# Patient Record
Sex: Female | Born: 1995 | Race: Black or African American | Hispanic: No | Marital: Single | Smoking: Current every day smoker
Health system: Southern US, Community
[De-identification: ages and names within clinical notes are randomized; demographics above are authoritative.]

## PROBLEM LIST (undated history)

## (undated) DIAGNOSIS — J45909 Unspecified asthma, uncomplicated: Secondary | ICD-10-CM

## (undated) HISTORY — PX: OTHER SURGICAL HISTORY: SHX169

## (undated) HISTORY — PX: TONSILLECTOMY: SUR1361

---

## 2018-07-28 ENCOUNTER — Encounter (HOSPITAL_COMMUNITY): Payer: Self-pay

## 2018-07-28 ENCOUNTER — Emergency Department (HOSPITAL_COMMUNITY)
Admission: EM | Admit: 2018-07-28 | Discharge: 2018-07-29 | Disposition: A | Discharge status: Home / Self Care | Payer: Self-pay | Attending: Emergency Medicine | Admitting: Emergency Medicine

## 2018-07-28 ENCOUNTER — Emergency Department (HOSPITAL_COMMUNITY): Payer: Self-pay

## 2018-07-28 ENCOUNTER — Other Ambulatory Visit: Payer: Self-pay

## 2018-07-28 DIAGNOSIS — J45909 Unspecified asthma, uncomplicated: Secondary | ICD-10-CM | POA: Insufficient documentation

## 2018-07-28 DIAGNOSIS — R52 Pain, unspecified: Secondary | ICD-10-CM

## 2018-07-28 DIAGNOSIS — N801 Endometriosis of ovary: Secondary | ICD-10-CM | POA: Insufficient documentation

## 2018-07-28 DIAGNOSIS — F17210 Nicotine dependence, cigarettes, uncomplicated: Secondary | ICD-10-CM | POA: Insufficient documentation

## 2018-07-28 DIAGNOSIS — N80129 Deep endometriosis of ovary, unspecified ovary: Secondary | ICD-10-CM

## 2018-07-28 DIAGNOSIS — R1032 Left lower quadrant pain: Secondary | ICD-10-CM

## 2018-07-28 HISTORY — DX: Unspecified asthma, uncomplicated: J45.909

## 2018-07-28 LAB — URINALYSIS, ROUTINE W REFLEX MICROSCOPIC
Bacteria, UA: NONE SEEN
Bilirubin Urine: NEGATIVE
Glucose, UA: NEGATIVE mg/dL
Ketones, ur: NEGATIVE mg/dL
Leukocytes,Ua: NEGATIVE
Nitrite: NEGATIVE
Protein, ur: NEGATIVE mg/dL
Specific Gravity, Urine: 1.02 (ref 1.005–1.030)
pH: 7 (ref 5.0–8.0)

## 2018-07-28 LAB — WET PREP, GENITAL
Sperm: NONE SEEN
Trich, Wet Prep: NONE SEEN
Yeast Wet Prep HPF POC: NONE SEEN

## 2018-07-28 LAB — COMPREHENSIVE METABOLIC PANEL
ALT: 14 U/L (ref 0–44)
AST: 26 U/L (ref 15–41)
Albumin: 4.9 g/dL (ref 3.5–5.0)
Alkaline Phosphatase: 60 U/L (ref 38–126)
Anion gap: 7 (ref 5–15)
BUN: 7 mg/dL (ref 6–20)
CO2: 25 mmol/L (ref 22–32)
Calcium: 9.7 mg/dL (ref 8.9–10.3)
Chloride: 106 mmol/L (ref 98–111)
Creatinine, Ser: 0.69 mg/dL (ref 0.44–1.00)
GFR calc Af Amer: >60 mL/min (ref >60)
GFR calc non Af Amer: >60 mL/min (ref >60)
Glucose, Bld: 80 mg/dL (ref 70–99)
Potassium: 3.7 mmol/L (ref 3.5–5.1)
Sodium: 138 mmol/L (ref 135–145)
Total Bilirubin: 0.9 mg/dL (ref 0.3–1.2)
Total Protein: 8.4 g/dL — ABNORMAL HIGH (ref 6.5–8.1)

## 2018-07-28 LAB — CBC
HCT: 39.8 % (ref 36.0–46.0)
Hemoglobin: 12.5 g/dL (ref 12.0–15.0)
MCH: 30.3 pg (ref 26.0–34.0)
MCHC: 31.4 g/dL (ref 30.0–36.0)
MCV: 96.6 fL (ref 80.0–100.0)
Platelets: 347 10*3/uL (ref 150–400)
RBC: 4.12 MIL/uL (ref 3.87–5.11)
RDW: 12.6 % (ref 11.5–15.5)
WBC: 7.1 10*3/uL (ref 4.0–10.5)
nRBC: 0 % (ref 0.0–0.2)

## 2018-07-28 LAB — I-STAT BETA HCG BLOOD, ED (MC, WL, AP ONLY): I-stat hCG, quantitative: <5 m[IU]/mL (ref <5)

## 2018-07-28 LAB — LIPASE, BLOOD: Lipase: 28 U/L (ref 11–51)

## 2018-07-28 LAB — PREGNANCY, URINE: Preg Test, Ur: NEGATIVE

## 2018-07-28 MED ORDER — NAPROXEN 500 MG PO TABS: 500.0000 mg | ORAL_TABLET | Freq: Two times a day (BID) | ORAL | 0 refills | Status: AC

## 2018-07-28 MED ORDER — SODIUM CHLORIDE 0.9% FLUSH: 3.0000 mL | Freq: Once | INTRAVENOUS | Status: DC

## 2018-07-28 MED ORDER — AZITHROMYCIN 250 MG PO TABS
1000.0000 mg | ORAL_TABLET | Freq: Once | ORAL | Status: AC
  Administered 2018-07-28: 1000 mg via ORAL
  Filled 2018-07-28: qty 4

## 2018-07-28 MED ORDER — CEFTRIAXONE SODIUM 250 MG IJ SOLR
250.0000 mg | Freq: Once | INTRAMUSCULAR | Status: AC
  Administered 2018-07-28: 22:00:00 250 mg via INTRAMUSCULAR
  Filled 2018-07-28: qty 250

## 2018-07-28 MED ORDER — IOHEXOL 300 MG/ML  SOLN
100.0000 mL | Freq: Once | INTRAMUSCULAR | Status: AC | PRN
  Administered 2018-07-28: 80 mL via INTRAVENOUS

## 2018-07-28 MED ORDER — STERILE WATER FOR INJECTION IJ SOLN
INTRAMUSCULAR | Status: AC
  Filled 2018-07-28: qty 10

## 2018-07-28 NOTE — ED Notes (Signed)
Pelvic supplies at bedside. 

## 2018-07-28 NOTE — Discharge Instructions (Addendum)
You were seen in the ED for vaginal bleeding and pelvic pain.    Ultrasound showed RIGHT endometrioma.  You need a second pelvic ultrasound in 6-12 weeks to re-evaluate this area.  Call OBGYN clinic to make an appointment.   Return for heavy vaginal bleeding, worsening pelvic or abdominal pain, vomiting, diarrhea, fever

## 2018-07-28 NOTE — ED Triage Notes (Signed)
Patient c/o abdominal pain x 3 days. Patient states she began having vaginal bleeding and vomiting last night.

## 2018-07-28 NOTE — ED Provider Notes (Addendum)
[facility]-EMERGENCY DEPT Provider Note   CSN: [id number] Arrival date & time: 07/28/18  1712    History   Chief Complaint Chief Complaint  Patient presents with   Abdominal Pain   Vaginal Bleeding   Emesis    HPI Tracey Peck is a 23 y.o. female with history of ovarian cysts is here for evaluation of abdominal pain.  Onset 3 days ago.  Lower and left-sided, intermittent, sharp and stabbing.  No abdomina pain currently.  Associated with vaginal spotting that began yesterday.  She has used 2 pads in the last 24 hours.  She took ibuprofen with temporary relief.  Her LMP was 4/16 to 4/19.  States she has very irregular cycles and should not be having vaginal bleeding again.  She has also noticed some urinary urgency and urination makes her sharp pains in her abdomen hurt worse.  Nausea and one episode of vomiting yesterday but none today.  She is sexually active with one female partner without condom use, no birth control.  Was diagnosed with an ovarian cyst 6 months ago and feels like the symptoms today are similar.  No modifying factors.  No associated fever, diarrhea, blood in stool, constipation, dysuria, frequency, changes to vaginal discharge.  No abdominal surgeries in the past. HPI  Past Medical History:  Diagnosis Date   Asthma     There are no active problems to display for this patient.   Past Surgical History:  Procedure Laterality Date   thumb fracture surgery Right    TONSILLECTOMY       OB History   No obstetric history on file.      Home Medications    Prior to Admission medications   Medication Sig Start Date End Date Taking? Authorizing Provider  ibuprofen (ADVIL) 200 MG tablet Take 400 mg by mouth every 6 (six) hours as needed for moderate pain.   Yes [provider]    Family History Family History  Problem Relation Age of Onset   Asthma Mother    Irritable bowel syndrome Mother     Social  History Social History   Tobacco Use   Smoking status: Current Every Day Smoker    Packs/day: 0.15    Types: Cigarettes   Smokeless tobacco: Never Used  Substance Use Topics   Alcohol use: Never    Frequency: Never   Drug use: Never     Allergies   Other   Review of Systems Review of Systems  Gastrointestinal: Positive for abdominal pain, nausea and vomiting.  Genitourinary: Positive for pelvic pain, urgency and vaginal bleeding.  All other systems reviewed and are negative.    Physical Exam Updated Vital Signs BP 130/76 (BP Location: Right Arm)    Pulse 94    Temp 99.2 F (37.3 C) (Oral)    Resp 17    Ht  (1.626 m)    Wt 49.9 kg    LMP 07/16/2018    SpO2 93%    BMI 18.88 kg/m   Physical Exam Vitals signs and nursing note reviewed.  Constitutional:      General: She is not in acute distress.    Appearance: She is well-developed.     Comments: NAD.  HENT:     Head: Normocephalic and atraumatic.     Right Ear: External ear normal.     Left Ear: External ear normal.     Nose: Nose normal.  Eyes:     General: No scleral icterus.  Conjunctiva/sclera: Conjunctivae normal.  Neck:     Musculoskeletal: Normal range of motion and neck supple.  Cardiovascular:     Rate and Rhythm: Normal rate and regular rhythm.     Heart sounds: Normal heart sounds. No murmur.  Pulmonary:     Effort: Pulmonary effort is normal.     Breath sounds: Normal breath sounds. No wheezing.  Abdominal:     General: Abdomen is flat.     Palpations: Abdomen is soft.     Tenderness: There is abdominal tenderness.     Comments: Mild left lower quadrant, left mid abdominal tenderness.  Mild very low left CVA tenderness.  No guarding, rigidity, rebound.  No distention.  Active BS to lower quadrants.  No abdominal or inguinal hernias noted.  Genitourinary:    Vagina: Bleeding present.     Adnexa:        Left: Tenderness present.      Comments:  Exam performed with RN at bedside.   External genitalia without lesions.  No groin lymphadenopathy.  Vaginal mucosa and cervix pink without lesions, friability.  Moderate amount of blood to the left of the cervix.  Cervix is closed.  No CMT.  Nontender right adnexa.  Left adnexa with some tenderness, no fullness.  Perianal skin is normal. Musculoskeletal: Normal range of motion.        General: No deformity.  Skin:    General: Skin is warm and dry.     Capillary Refill: Capillary refill takes less than 2 seconds.  Neurological:     Mental Status: She is alert and oriented to person, place, and time.  Psychiatric:        Behavior: Behavior normal.        Thought Content: Thought content normal.        Judgment: Judgment normal.      ED Treatments / Results  Labs (all labs ordered are listed, but only abnormal results are displayed) Labs Reviewed  WET PREP, GENITAL - Abnormal; Notable for the following components:      Result Value   Clue Cells Wet Prep HPF POC PRESENT (*)    WBC, Wet Prep HPF POC FEW (*)    All other components within normal limits  COMPREHENSIVE METABOLIC PANEL - Abnormal; Notable for the following components:   Total Protein 8.4 (*)    All other components within normal limits  URINALYSIS, ROUTINE W REFLEX MICROSCOPIC - Abnormal; Notable for the following components:   Hgb urine dipstick SMALL (*)    All other components within normal limits  LIPASE, BLOOD  CBC  PREGNANCY, URINE  I-STAT BETA HCG BLOOD, ED ([facility], [facility], [facility] ONLY)  GC/CHLAMYDIA PROBE AMP ([place]) NOT AT [facility]    EKG None  Radiology US Pelvis Complete  Result Date: 07/28/2018 CLINICAL DATA:  23 year old female with left lower abdominal and pelvic pain and vaginal bleeding. LMP 07/16/2018. EXAM: TRANSABDOMINAL AND TRANSVAGINAL ULTRASOUND OF PELVIS DOPPLER ULTRASOUND OF OVARIES TECHNIQUE: Both transabdominal and transvaginal ultrasound examinations of the pelvis were performed. Transabdominal technique was performed for global  imaging of the pelvis including uterus, ovaries, adnexal regions, and pelvic cul-de-sac. It was necessary to proceed with endovaginal exam following the transabdominal exam to visualize the ovaries. Color and duplex Doppler ultrasound was utilized to evaluate blood flow to the ovaries. COMPARISON:  None. FINDINGS: Uterus Measurements: 7.5 x 3.4 x 4.4 centimeters = volume: 58 mL. Small hypoechoic intramural fibroid in the ventral body on image 23 measures 8 millimeters. Endometrium  Thickness: 14 millimeters.  No focal abnormality visualized. Right ovary Measurements: 4.5 x 2.8 x 3.8 centimeters. There is a 2.1 centimeter simple appearing cystic component of the right ovary with no internal vascular elements (image 33), and a larger 3 centimeter round complex area with homogeneous low level internal echoes. No solid or vascular components evident (image 36). Left ovary Measurements: 3.0 x 1.7 x 3.3 centimeters = volume: 9 mL. Multiple small follicles. Normal appearance/no adnexal mass. Pulsed Doppler evaluation of both ovaries demonstrates normal low-resistance arterial and venous waveforms. Other findings No pelvic free fluid. IMPRESSION: 1. Normal left ovary, and no evidence of ovarian torsion. 2. Right ovary positive for a 3 cm complex area which most resembles Endometrioma, and a superimposed 2.1 cm simple cyst. Recommend initial right ovary ultrasound follow-up in 6-12 weeks and then annually if not treated surgically. This recommendation follows the consensus statement: Management of Asymptomatic Ovarian and Other Adnexal Cysts Imaged at [place]: Society of Radiologists in Ultrasound Consensus Conference Statement. Radiology 2010; [phone]. 3. Normal uterus aside from suspected 8 mm intramural fibroid. Electronically Signed   By: Odessa Fleming M.D.   On: 07/28/2018 21:24   [place] Art/ven Flow Abd Pelv Doppler  Result Date: 07/28/2018 CLINICAL DATA:  23 year old female with left lower abdominal and pelvic pain and  vaginal bleeding. LMP 07/16/2018. EXAM: TRANSABDOMINAL AND TRANSVAGINAL ULTRASOUND OF PELVIS DOPPLER ULTRASOUND OF OVARIES TECHNIQUE: Both transabdominal and transvaginal ultrasound examinations of the pelvis were performed. Transabdominal technique was performed for global imaging of the pelvis including uterus, ovaries, adnexal regions, and pelvic cul-de-sac. It was necessary to proceed with endovaginal exam following the transabdominal exam to visualize the ovaries. Color and duplex Doppler ultrasound was utilized to evaluate blood flow to the ovaries. COMPARISON:  None. FINDINGS: Uterus Measurements: 7.5 x 3.4 x 4.4 centimeters = volume: 58 mL. Small hypoechoic intramural fibroid in the ventral body on image 23 measures 8 millimeters. Endometrium Thickness: 14 millimeters.  No focal abnormality visualized. Right ovary Measurements: 4.5 x 2.8 x 3.8 centimeters. There is a 2.1 centimeter simple appearing cystic component of the right ovary with no internal vascular elements (image 33), and a larger 3 centimeter round complex area with homogeneous low level internal echoes. No solid or vascular components evident (image 36). Left ovary Measurements: 3.0 x 1.7 x 3.3 centimeters = volume: 9 mL. Multiple small follicles. Normal appearance/no adnexal mass. Pulsed Doppler evaluation of both ovaries demonstrates normal low-resistance arterial and venous waveforms. Other findings No pelvic free fluid. IMPRESSION: 1. Normal left ovary, and no evidence of ovarian torsion. 2. Right ovary positive for a 3 cm complex area which most resembles Endometrioma, and a superimposed 2.1 cm simple cyst. Recommend initial right ovary ultrasound follow-up in 6-12 weeks and then annually if not treated surgically. This recommendation follows the consensus statement: Management of Asymptomatic Ovarian and Other Adnexal Cysts Imaged at [place]: Society of Radiologists in Ultrasound Consensus Conference Statement. Radiology 2010; [id number]. 3.  Normal uterus aside from suspected 8 mm intramural fibroid. Electronically Signed   By: Odessa Fleming M.D.   On: 07/28/2018 21:24   US Pelvic Complete W Transvaginal And Torsion R/o  Result Date: 07/28/2018 CLINICAL DATA:  23 year old female with left lower abdominal and pelvic pain and vaginal bleeding. LMP 07/16/2018. EXAM: TRANSABDOMINAL AND TRANSVAGINAL ULTRASOUND OF PELVIS DOPPLER ULTRASOUND OF OVARIES TECHNIQUE: Both transabdominal and transvaginal ultrasound examinations of the pelvis were performed. Transabdominal technique was performed for global imaging of the pelvis including uterus, ovaries, adnexal regions, and pelvic  cul-de-sac. It was necessary to proceed with endovaginal exam following the transabdominal exam to visualize the ovaries. Color and duplex Doppler ultrasound was utilized to evaluate blood flow to the ovaries. COMPARISON:  None. FINDINGS: Uterus Measurements: 7.5 x 3.4 x 4.4 centimeters = volume: 58 mL. Small hypoechoic intramural fibroid in the ventral body on image 23 measures 8 millimeters. Endometrium Thickness: 14 millimeters.  No focal abnormality visualized. Right ovary Measurements: 4.5 x 2.8 x 3.8 centimeters. There is a 2.1 centimeter simple appearing cystic component of the right ovary with no internal vascular elements (image 33), and a larger 3 centimeter round complex area with homogeneous low level internal echoes. No solid or vascular components evident (image 36). Left ovary Measurements: 3.0 x 1.7 x 3.3 centimeters = volume: 9 mL. Multiple small follicles. Normal appearance/no adnexal mass. Pulsed Doppler evaluation of both ovaries demonstrates normal low-resistance arterial and venous waveforms. Other findings No pelvic free fluid. IMPRESSION: 1. Normal left ovary, and no evidence of ovarian torsion. 2. Right ovary positive for a 3 cm complex area which most resembles Endometrioma, and a superimposed 2.1 cm simple cyst. Recommend initial right ovary ultrasound follow-up  in 6-12 weeks and then annually if not treated surgically. This recommendation follows the consensus statement: Management of Asymptomatic Ovarian and Other Adnexal Cysts Imaged at [place]: Society of Radiologists in Ultrasound Consensus Conference Statement. Radiology 2010; [phone]. 3. Normal uterus aside from suspected 8 mm intramural fibroid. Electronically Signed   By: Odessa Fleming M.D.   On: 07/28/2018 21:24    Procedures Procedures (including critical care time)  Medications Ordered in ED Medications  sodium chloride flush (NS) 0.9 % injection 3 mL (has no administration in time range)  cefTRIAXone (ROCEPHIN) injection 250 mg (has no administration in time range)  azithromycin (ZITHROMAX) tablet 1,000 mg (has no administration in time range)  sterile water (preservative free) injection (has no administration in time range)     Initial Impression / Assessment and Plan / ED Course  I have reviewed the triage vital signs and the nursing notes.  Pertinent labs & imaging results that were available during my care of the patient were reviewed by me and considered in my medical decision making (see chart for details).  Clinical Course as of Jul 27 2198  Tue Jul 28, 2018  1825 Hgb urine dipstick(!): SMALL [CG]  1825 RBC / HPF: 11-20 [CG]  1825 WBC, UA: 0-5 [CG]  1825 Bacteria, UA: NONE SEEN [CG]  1825 Squamous Epithelial / LPF: 0-5 [CG]  2133 IMPRESSION: 1. Normal left ovary, and no evidence of ovarian torsion. 2. Right ovary positive for a 3 cm complex area which most resembles Endometrioma, and a superimposed 2.1 cm simple cyst. Recommend initial right ovary ultrasound follow-up in 6-12 weeks and then annually if not treated surgically. This recommendation follows the consensus statement: Management of Asymptomatic Ovarian and Other Adnexal Cysts Imaged at [place]: Society of Radiologists in Ultrasound Consensus Conference Statement. Radiology 2010; [phone]. 3. Normal uterus aside from  suspected 8 mm intramural fibroid.  US Pelvis Complete [CG]    Clinical Course User Index [CG] Liberty Handy, PA-C      ddx includes hemorrhagic cyst vs irregular dysmenorrhea.  Given sexual practices also considering PID.  She has no UTI symptoms but mild LLQ, left mid abdomen and low left CVAT however renal stone vs early pyelonephritis would be rare with vaginal bleeding. Will do pelvic.   2006: Pelvic as above suggestive of pelvic etiology less likely renal  stone/pyelonephritis. Will get pelvic US. UA with RBCs in setting of vaginal bleeding, no infection.   2200: Pelvic US showed RIGHT endometrioma and cyst, left side normal. Discussed with pt, admits to noticing dyspareunia.  She has no fmh of endometriosis or dysmenorrhea, irregular cycles.  Repeat abd exam, she has persistent LLQ, left mid, left low CVA tenderness. UA w/o infection but RBCs in setting of vaginal bleeding. Unlikely that there are endometriomas in abdominal/renal cavities but given persistent pain on opposite side will obtain CTAP to further evaluation.  Discussed with EDMD.   Final Clinical Impressions(s) / ED Diagnoses   Pt handed off to oncoming EDPA who will f/u on CTAP. Anticipate discharge with naproxen and OBGYN f/u. Plan discussed with patient.  Final diagnoses:  Endometrioma of ovary    ED Discharge Orders    None       Jerrell MylarGibbons, Leliana Kontz J, PA-C 07/28/18 2200    Lorre NickAllen, Anthony, MD 07/29/18 2250

## 2018-07-29 LAB — GC/CHLAMYDIA PROBE AMP (~~LOC~~) NOT AT ARMC
Chlamydia: NEGATIVE
Neisseria Gonorrhea: NEGATIVE

## 2018-07-29 NOTE — ED Provider Notes (Signed)
Patient signed out at end of shift by Sharen Heck, PA-C, pending CT abd/pel result. Here for evaluation LLQ abdominal pain and vaginal bleeding. Work up essentially negative from [employer]. Pelvic US showing right adnexal findings but negative left, CT ordered for full evaluation.   CT scan is negative for source of LLQ pain or pathology. Recheck of the patient finds her comfortable appearing. Updated on results and recommendation for follow up. She can be discharged home per plan of previous treatment team.    Elpidio Anis, PA-C 07/29/18 0013    Lorre Nick, MD 07/29/18 2250

## 2018-07-29 NOTE — ED Notes (Signed)
Pt given and verbalized understanding of d/c instructions and need for follow up with pcp and OB/gyn. Told to return if s/s worsen. No further distress or questions upon ambulating out of department

## 2019-10-01 IMAGING — US US PELVIS COMPLETE
1 series · 13 of 25 positions shown · non-contrast
Comparison: None.

CLINICAL DATA: 22-year-old female with left lower abdominal and
pelvic pain and vaginal bleeding. LMP 07/16/2018.

EXAM:
TRANSABDOMINAL AND TRANSVAGINAL ULTRASOUND OF PELVIS
DOPPLER ULTRASOUND OF OVARIES
TECHNIQUE: Both transabdominal and transvaginal ultrasound examinations of the
pelvis were performed. Transabdominal technique was performed for
global imaging of the pelvis including uterus, ovaries, adnexal
regions, and pelvic cul-de-sac.
It was necessary to proceed with endovaginal exam following the
transabdominal exam to visualize the ovaries. Color and duplex
Doppler ultrasound was utilized to evaluate blood flow to the
ovaries.

[Series 1: us pelvis complete · 82 acquisitions, 13 frames shown]
[im 1/82]
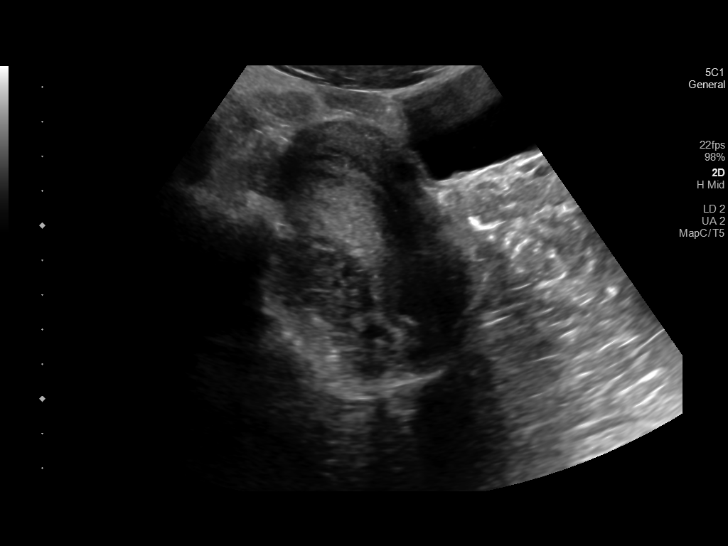
[im 7/82]
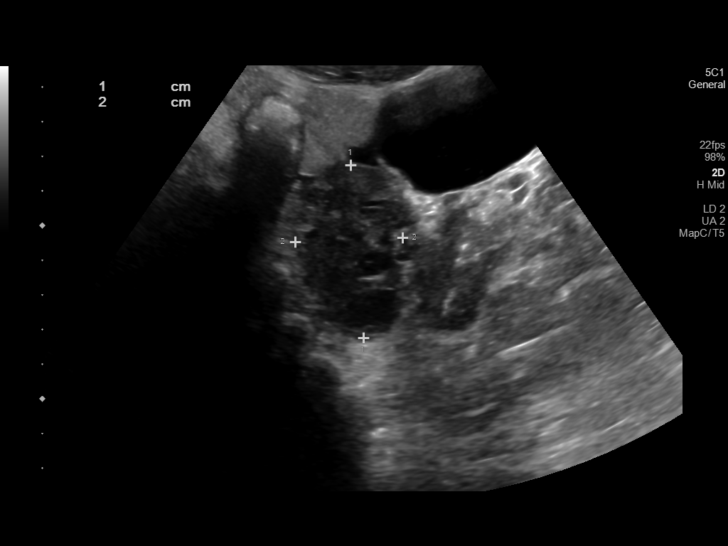
[im 14/82]
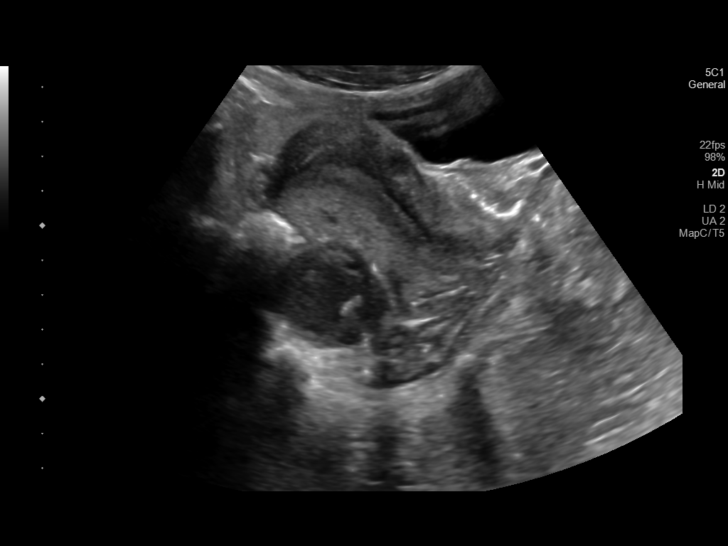
[im 21/82]
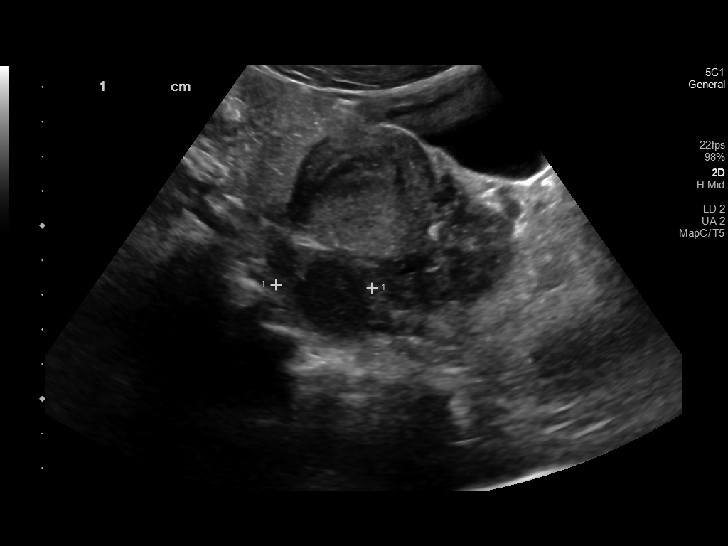
[im 28/82]
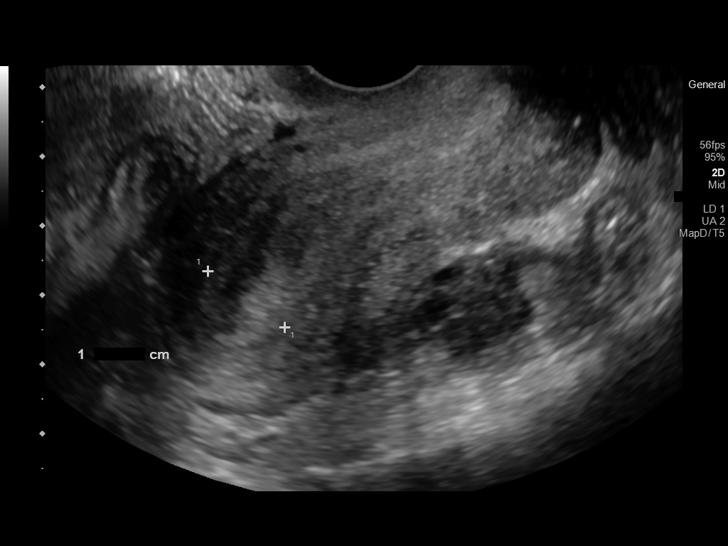
[im 34/82]
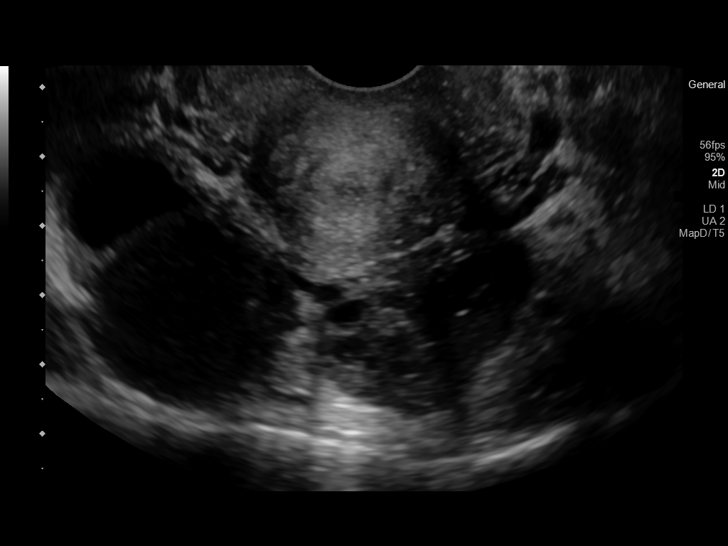
[im 41/82]
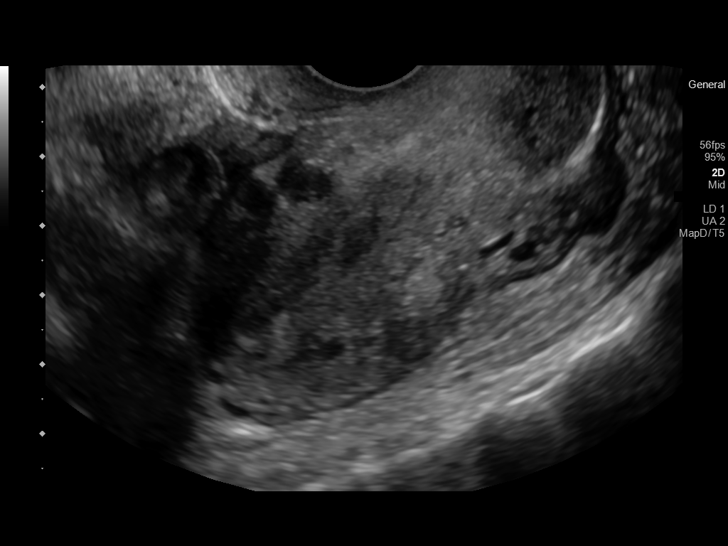
[im 48/82]
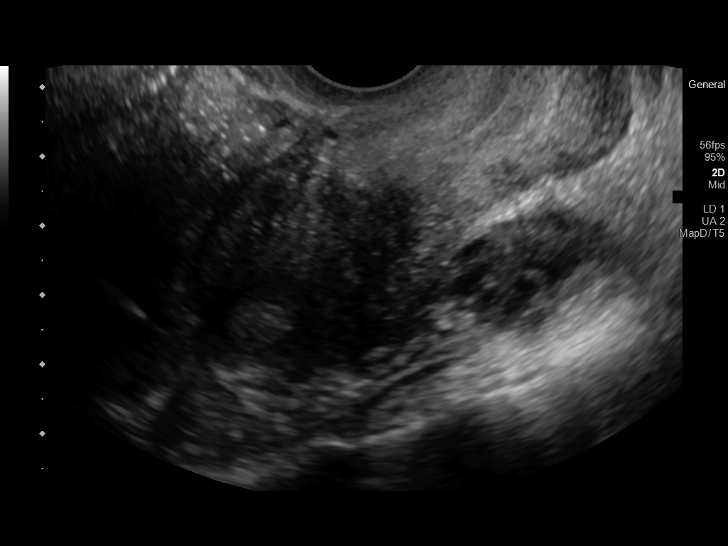
[im 55/82]
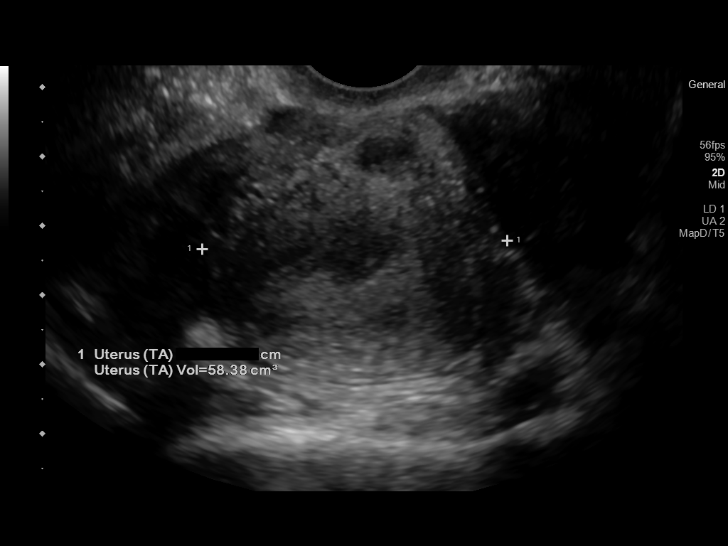
[im 61/82]
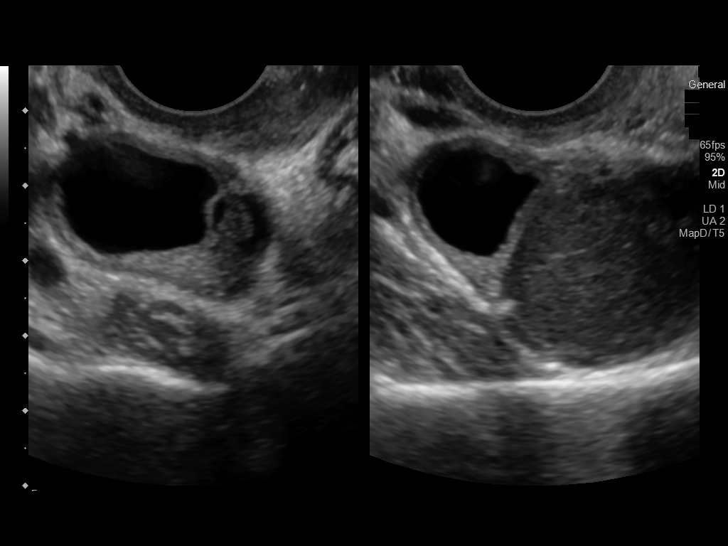
[im 68/82]
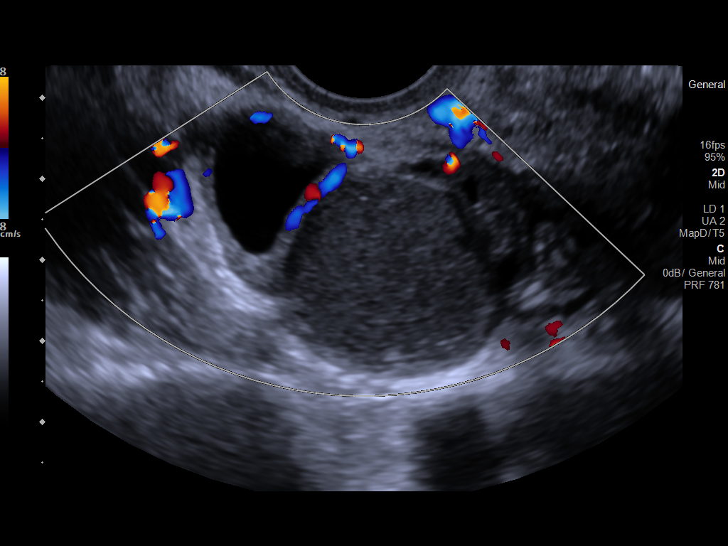
[im 75/82]
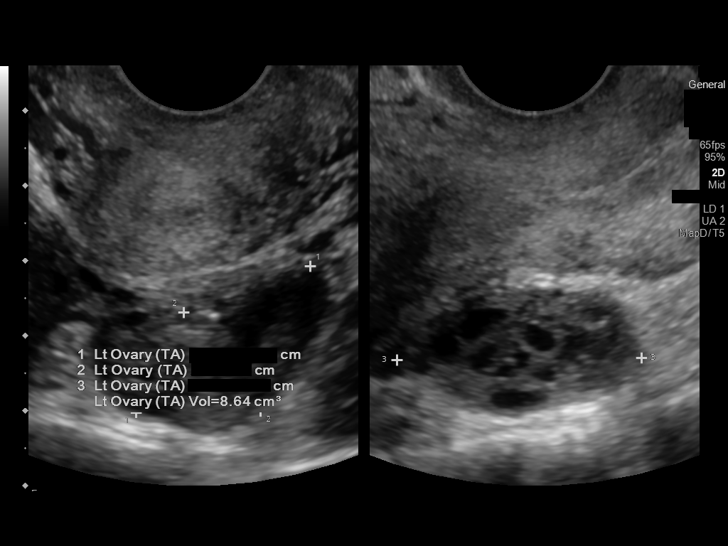
[im 82/82]
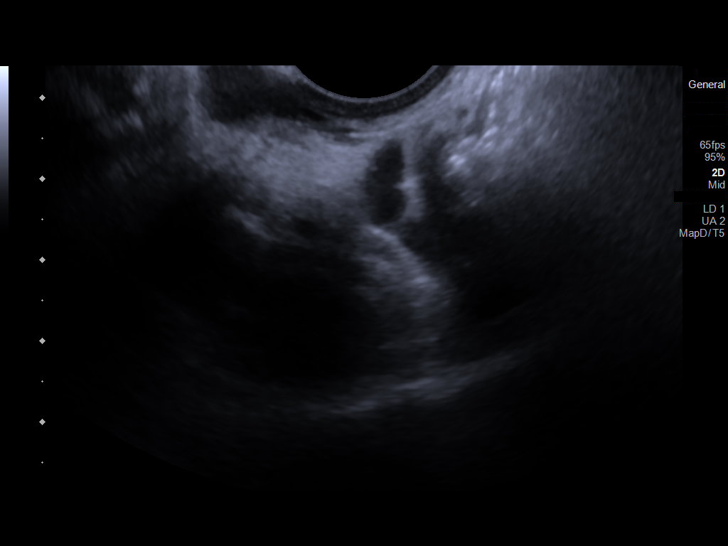

[13 of 25 positions shown; findings below may reference images not displayed]

FINDINGS: Uterus

Measurements: 7.5 x 3.4 x 4.4 centimeters = volume: 58 mL. Small
hypoechoic intramural fibroid in the ventral body on image 23
measures 8 millimeters.

Endometrium

Thickness: 14 millimeters.  No focal abnormality visualized.

Right ovary

Measurements: 4.5 x 2.8 x 3.8 centimeters. There is a 2.1 centimeter
simple appearing cystic component of the right ovary with no
internal vascular elements (image 33), and a larger 3 centimeter
round complex area with homogeneous low level internal echoes. No
solid or vascular components evident (image 36).

Left ovary

Measurements: 3.0 x 1.7 x 3.3 centimeters = volume: 9 mL. Multiple
small follicles. Normal appearance/no adnexal mass.

Pulsed Doppler evaluation of both ovaries demonstrates normal
low-resistance arterial and venous waveforms.

Other findings

No pelvic free fluid.
IMPRESSION: 1. Normal left ovary, and no evidence of ovarian torsion.
2. Right ovary positive for a 3 cm complex area which most resembles
Endometrioma, and a superimposed 2.1 cm simple cyst.
Recommend initial right ovary ultrasound follow-up in 6-12 weeks and
then annually if not treated surgically.
This recommendation follows the consensus statement: Management of
Asymptomatic Ovarian and Other Adnexal Cysts Imaged at [place]: Society
of Radiologists in Ultrasound Consensus Conference Statement.
3. Normal uterus aside from suspected 8 mm intramural fibroid.

## 2019-10-01 IMAGING — CT CT ABDOMEN AND PELVIS WITH CONTRAST
2 of 4 series · 16 of 46 positions shown, 18 images · IV contrast (ISOVUE)
Comparison: Pelvic ultrasound on 07/28/2018

CLINICAL DATA: Left lower quadrant pain for 3 days. Nausea and
vomiting.

EXAM:
CT ABDOMEN AND PELVIS WITH CONTRAST
TECHNIQUE: Multidetector CT imaging of the abdomen and pelvis was performed
using the standard protocol following bolus administration of
intravenous contrast.
CONTRAST:  80mL OMNIPAQUE IOHEXOL 300 MG/ML  SOLN

[Series 2: axial st · axial · 0.68mm/px · z∈[-471,-101]mm · 13 of 84 slices shown, 15 images]
[im 5/84  soft-tissue]
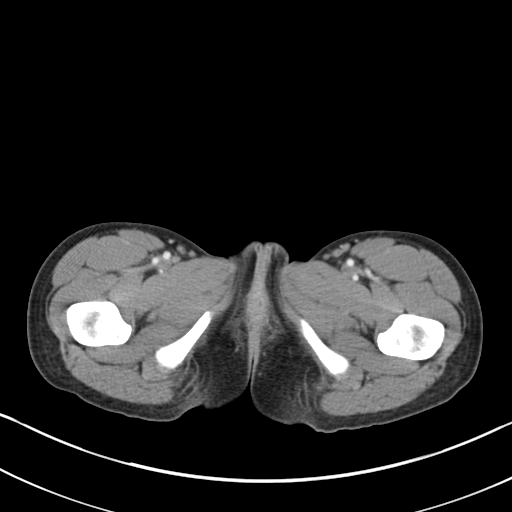
[im 5/84  bone]
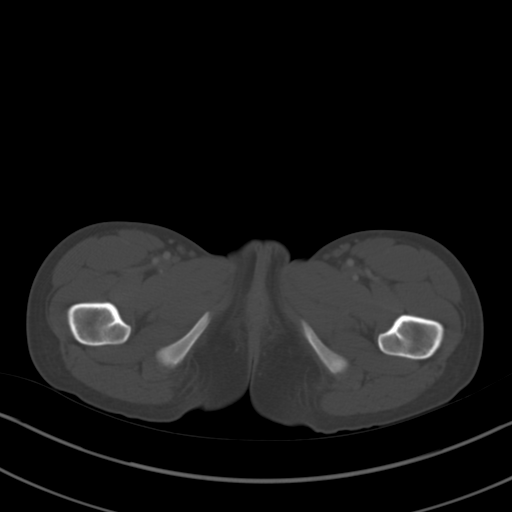
[im 10/84  soft-tissue]
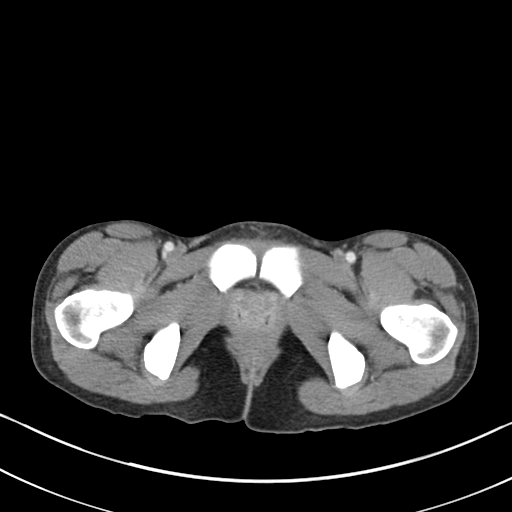
[im 19/84  soft-tissue]
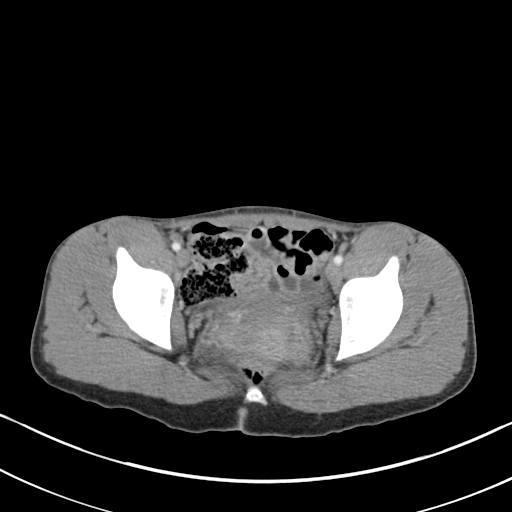
[im 24/84  soft-tissue]
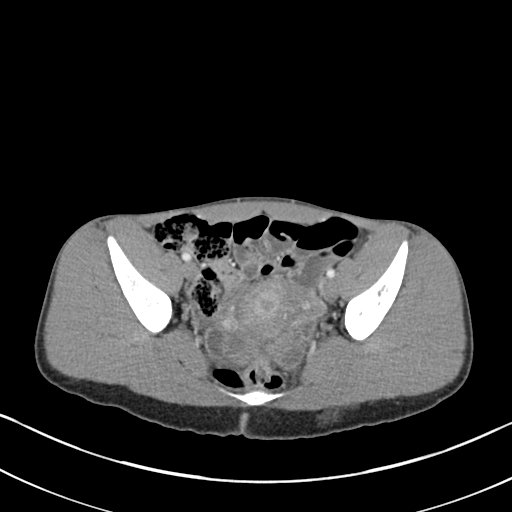
[im 28/84  soft-tissue]
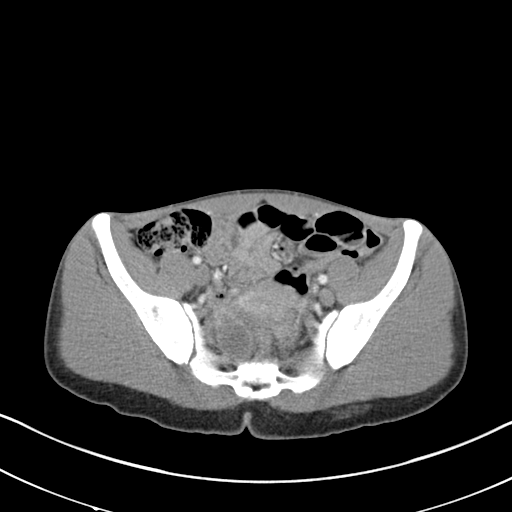
[im 37/84  soft-tissue]
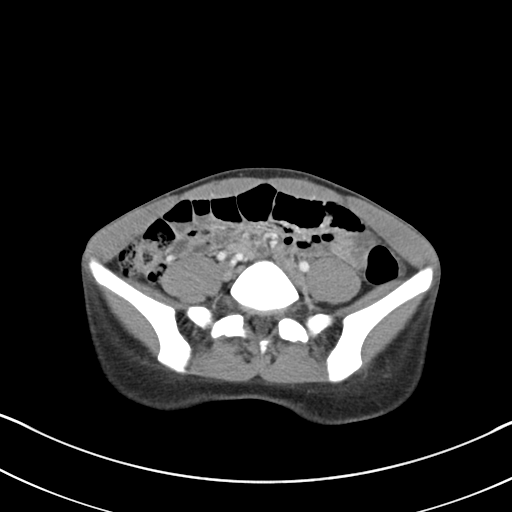
[im 42/84  soft-tissue]
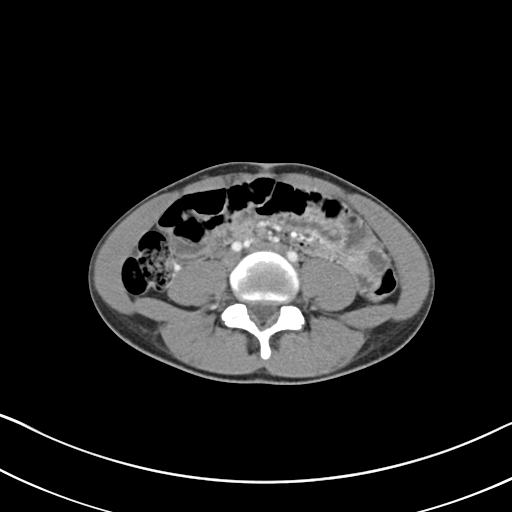
[im 47/84  soft-tissue]
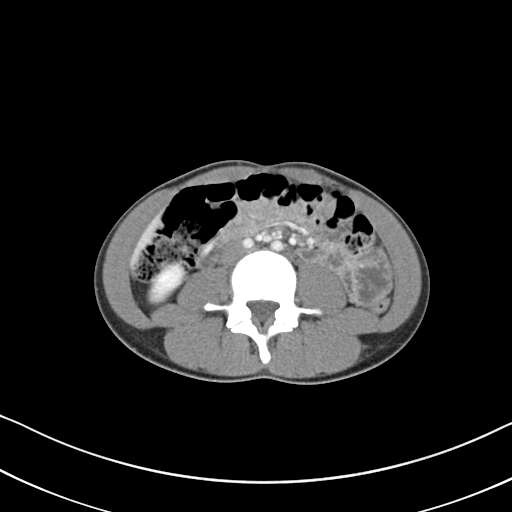
[im 56/84  soft-tissue]
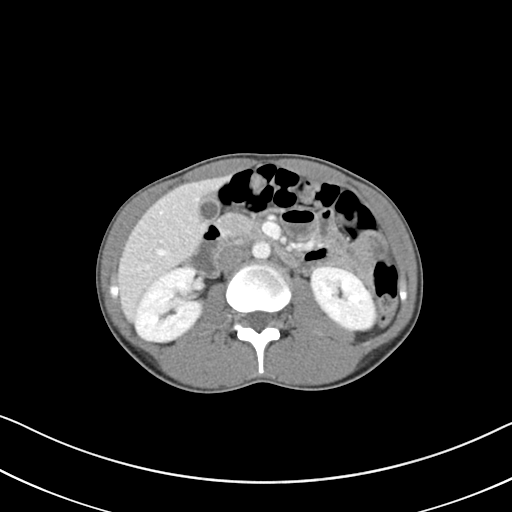
[im 56/84  bone]
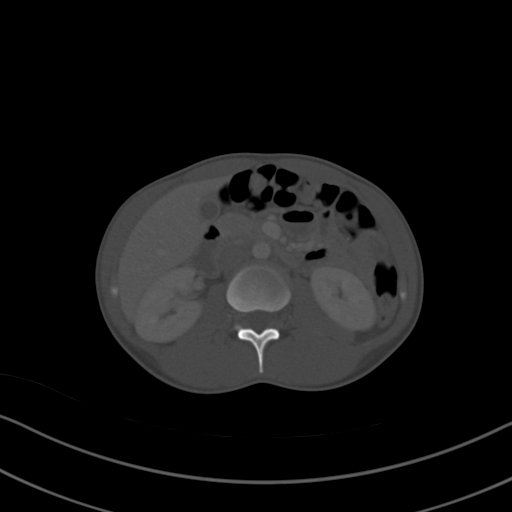
[im 60/84  soft-tissue]
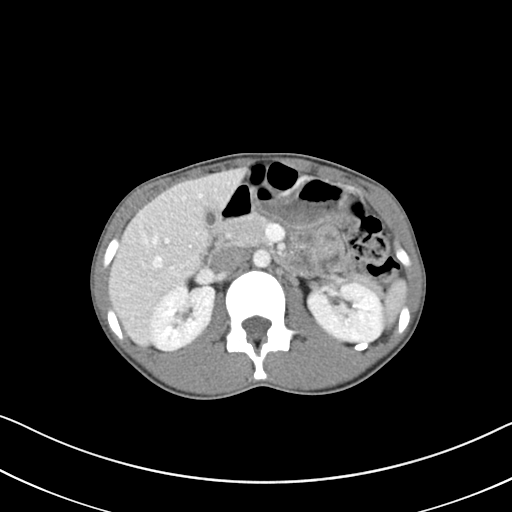
[im 65/84  soft-tissue]
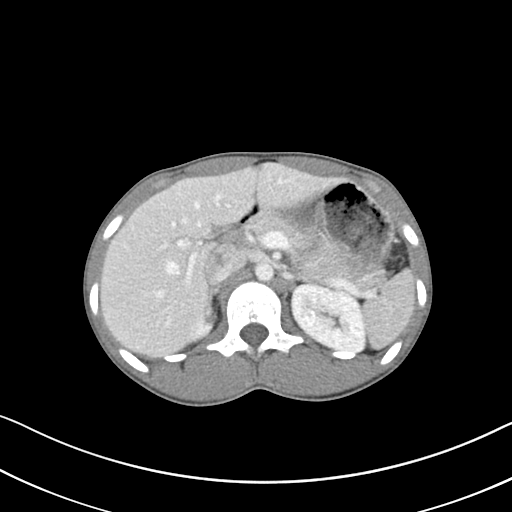
[im 74/84  soft-tissue]
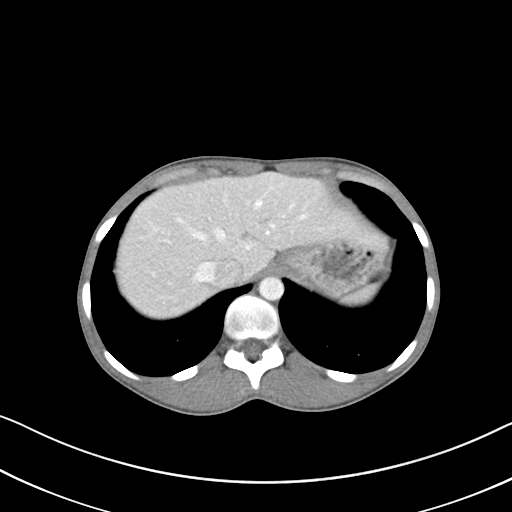
[im 79/84  soft-tissue]
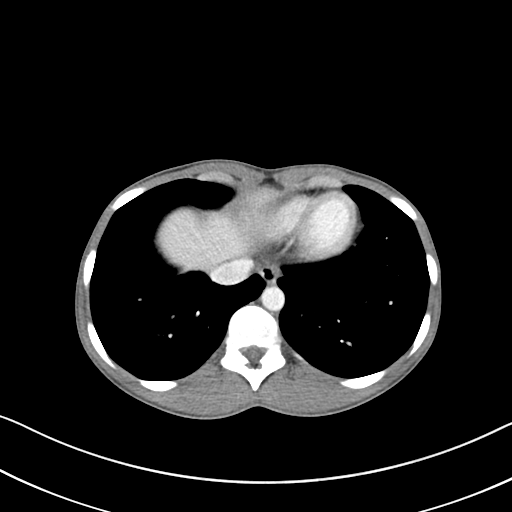

[Series 5: coronal st · coronal · 0.58mm/px · 3 of 103 slices shown]
[im 35/103  soft-tissue]
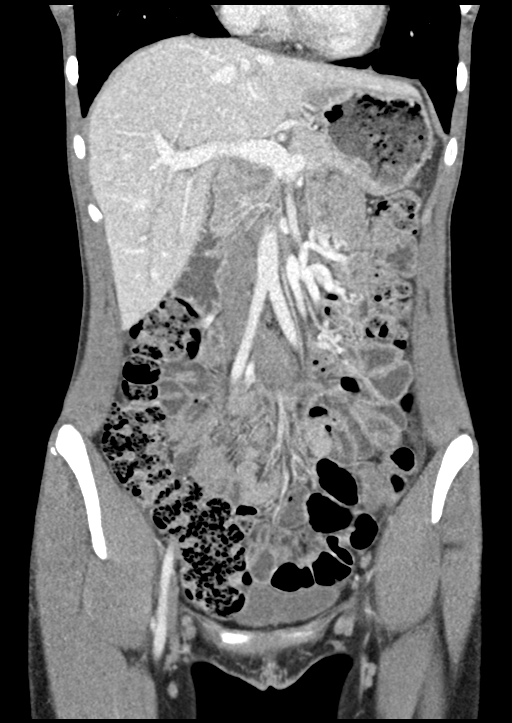
[im 46/103  soft-tissue]
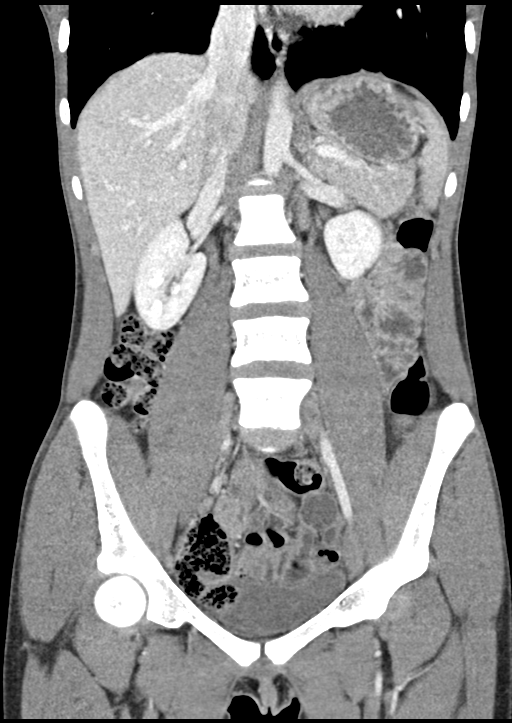
[im 57/103  soft-tissue]
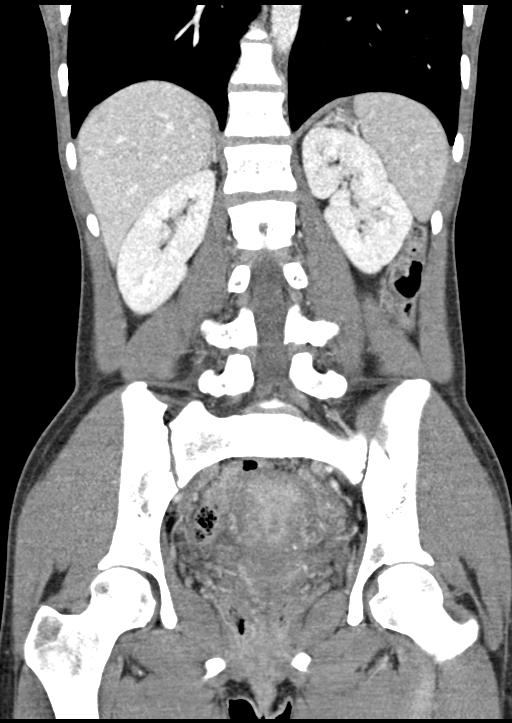

[16 of 46 positions shown; findings below may reference images not displayed]

FINDINGS: Lower Chest: No acute findings.

Hepatobiliary: No hepatic masses identified. Gallbladder is
unremarkable.

Pancreas:  No mass or inflammatory changes.

Spleen: Within normal limits in size and appearance.

Adrenals/Urinary Tract: No masses identified. No evidence of
hydronephrosis.

Stomach/Bowel: No evidence of obstruction, inflammatory process or
abnormal fluid collections.

Vascular/Lymphatic: No pathologically enlarged lymph nodes. No
abdominal aortic aneurysm.

Reproductive: Normal appearance of uterus. A 3.0 cm intermediate
attenuation cyst is seen in the right adnexa, which appears to
correlate with the complex cyst seen on recent ultrasound, and is
suspicious for endometrioma. No other pelvic mass identified. No
evidence of free fluid.

Other:  None.

Musculoskeletal:  No suspicious bone lesions identified.
IMPRESSION: 1. 3 cm intermediate attenuation cyst in right adnexa, which
correlates with the complex cystic lesion on recent ultrasound
suspicious for endometrioma. Recommend continued follow-up by pelvic
ultrasound in 6-12 weeks.
2. No other significant abnormality identified.
